# Patient Record
Sex: Female | Born: 1965 | Race: White | Hispanic: No | Marital: Single | Smoking: Never smoker
Health system: Southern US, Community
[De-identification: ages and names within clinical notes are randomized; demographics above are authoritative.]

## PROBLEM LIST (undated history)

## (undated) DIAGNOSIS — E039 Hypothyroidism, unspecified: Secondary | ICD-10-CM

## (undated) DIAGNOSIS — Z9989 Dependence on other enabling machines and devices: Secondary | ICD-10-CM

## (undated) DIAGNOSIS — F32A Depression, unspecified: Secondary | ICD-10-CM

## (undated) DIAGNOSIS — G4733 Obstructive sleep apnea (adult) (pediatric): Secondary | ICD-10-CM

## (undated) DIAGNOSIS — F419 Anxiety disorder, unspecified: Secondary | ICD-10-CM

## (undated) DIAGNOSIS — I10 Essential (primary) hypertension: Secondary | ICD-10-CM

## (undated) DIAGNOSIS — R0789 Other chest pain: Secondary | ICD-10-CM

## (undated) DIAGNOSIS — E079 Disorder of thyroid, unspecified: Secondary | ICD-10-CM

## (undated) DIAGNOSIS — F329 Major depressive disorder, single episode, unspecified: Secondary | ICD-10-CM

## (undated) HISTORY — DX: Essential (primary) hypertension: I10

## (undated) HISTORY — PX: CHOLECYSTECTOMY: SHX55

## (undated) HISTORY — DX: Obstructive sleep apnea (adult) (pediatric): G47.33

## (undated) HISTORY — DX: Disorder of thyroid, unspecified: E07.9

## (undated) HISTORY — DX: Other chest pain: R07.89

## (undated) HISTORY — DX: Morbid (severe) obesity due to excess calories: E66.01

## (undated) HISTORY — DX: Dependence on other enabling machines and devices: Z99.89

---

## 2005-03-11 ENCOUNTER — Ambulatory Visit: Payer: Self-pay | Admitting: Family Medicine

## 2005-03-25 ENCOUNTER — Ambulatory Visit: Payer: Self-pay | Admitting: Family Medicine

## 2011-02-13 ENCOUNTER — Encounter: Payer: Self-pay | Admitting: Cardiovascular Disease

## 2011-02-13 ENCOUNTER — Ambulatory Visit (INDEPENDENT_AMBULATORY_CARE_PROVIDER_SITE_OTHER): Payer: 59 | Admitting: Cardiovascular Disease

## 2011-02-13 DIAGNOSIS — R079 Chest pain, unspecified: Secondary | ICD-10-CM | POA: Insufficient documentation

## 2011-02-13 LAB — PROTIME-INR

## 2011-02-13 MED ORDER — METOPROLOL TARTRATE 25 MG PO TABS: 25.0000 mg | ORAL_TABLET | Freq: Two times a day (BID) | ORAL | Status: DC

## 2011-02-13 NOTE — Assessment & Plan Note (Addendum)
The patient has been having intermittent chest pain over the last few weeks which really overall is atypical in nature. Her risk factors for coronary artery disease include hypertension and family history of premature coronary artery disease. Her nuclear stress test was borderline abnormal and could have been due to  breast attenuation especially with her obesity. Due to all of that, I think the best way to proceed is with coronary CTA to define her coronary anatomy. In the meantime, I asked her to start taking aspirin 81 mg once daily and will also start her on metoprolol 25 mg twice daily. She is to take 50 mg in the morning of the CTA. If the CTA is not approved by her insurance company, then I recommend proceeding with cardiac catheterization for definitive diagnosis. I ordered labs on her which came back normal : Na: 141, K: 4.1, Bun: 11, Crea: 0.88, Hgb: 13.8, INR: 1.0

## 2011-02-13 NOTE — Patient Instructions (Addendum)
Your physician recommends that you schedule a follow-up appointment in: we will call her  Your physician recommends that you return for lab work in: today  Your physician has requested that you have cardiac CT. Cardiac computed tomography (CT) is a painless test that uses an x-ray machine to take clear, detailed pictures of your heart. For further information please visit [email or web address] Please follow instruction sheet as given.  Your physician has recommended you make the following change in your medication: START Metoprolol 25 mg twice daily

## 2011-02-13 NOTE — Progress Notes (Signed)
HPI  Is a 45 year old female who is here today for evaluation of chest pain and slightly abnormal nuclear stress test. The patient has no previous cardiac history. She has risk factors that include hypertension as well as family history of premature coronary artery disease. She started having intermittent chest pain which started about 3 weeks ago. It substernal with no radiation. It's overall mild in nature. It is described as a tightness feeling that lasts for 5 or 10 minutes at most. It happens at rest and does not worsen with activities. It can be triggered by anxiety and stress. It's not associated with dyspnea. There is no heartburn. She was seen by Dr. Benedetto Goad who started her on omeprazole and ordered a nuclear stress test. The patient was able to exercise for 4 minutes and 40 seconds and achieved a maximal heart rate of 162 beats per minute. She did not have any chest pain or ischemic ECG changes. Her nuclear imaging showed a slight decreased activity in the anterior wall on stress images compared with rest imaging. This was suggestive of possible mild anterior ischemia versus shifting breast attenuation.  No Known Allergies   No current outpatient prescriptions on file prior to visit.     Past Medical History  Diagnosis Date  . OSA on CPAP   . Chest tightness   . HTN (hypertension)   . Thyroid disease   . Morbid obesity      Past Surgical History  Procedure Date  . Cholecystectomy      Family History  Problem Relation Age of Onset  . Heart disease       History   Social History  . Marital Status: Single    Spouse Name: N/A    Number of Children: 0  . Years of Education: N/A   Occupational History  . [employer], [employer]    Social History Main Topics  . Smoking status: Never Smoker   . Smokeless tobacco: Not on file  . Alcohol Use: No  . Drug Use: No  . Sexually Active: Not on file   Other Topics Concern  . Not on file   Social History Narrative  . No  narrative on file     ROS Constitutional: Negative for fever, chills, diaphoresis, activity change, appetite change and fatigue.  HENT: Negative for hearing loss, nosebleeds, congestion, sore throat, facial swelling, drooling, trouble swallowing, neck pain, voice change, sinus pressure and tinnitus.  Eyes: Negative for photophobia, pain, discharge and visual disturbance.  Respiratory: Negative for apnea, cough,  shortness of breath and wheezing.  Cardiovascular: Negative for  palpitations and leg swelling.  Gastrointestinal: Negative for nausea, vomiting, abdominal pain, diarrhea, constipation, blood in stool and abdominal distention.  Genitourinary: Negative for dysuria, urgency, frequency, hematuria and decreased urine volume.  Musculoskeletal: Negative for myalgias, back pain, joint swelling, arthralgias and gait problem.  Skin: Negative for color change, pallor, rash and wound.  Neurological: Negative for dizziness, tremors, seizures, syncope, speech difficulty, weakness, light-headedness, numbness and headaches.  Psychiatric/Behavioral: Negative for suicidal ideas, hallucinations, behavioral problems and agitation. The patient is not nervous/anxious.     PHYSICAL EXAM   BP 141/97  Pulse 74  Ht 5\' 4"  (1.626 m)  Wt 315 lb (142.883 kg)  BMI 54.07 kg/m2  SpO2 94%  Constitutional: She is oriented to person, place, and time. She appears well-developed and well-nourished. No distress.  HENT: No nasal discharge.  Head: Normocephalic and atraumatic.  Eyes: Pupils are equal, round, and reactive to light. Right  eye exhibits no discharge. Left eye exhibits no discharge.  Neck: Normal range of motion. Neck supple. No JVD present. No thyromegaly present.  Cardiovascular: Normal rate, regular rhythm, normal heart sounds and intact distal pulses. Exam reveals no gallop and no friction rub.  No murmur heard.  Pulmonary/Chest: Effort normal and breath sounds normal. No stridor. No respiratory  distress. She has no wheezes. She has no rales. She exhibits no tenderness.  Abdominal: Soft. Bowel sounds are normal. She exhibits no distension. There is no tenderness. There is no rebound and no guarding.  Musculoskeletal: Normal range of motion. She exhibits no edema and no tenderness.  Neurological: She is alert and oriented to person, place, and time. Coordination normal.  Skin: Skin is warm and dry. No rash noted. She is not diaphoretic. No erythema. No pallor.  Psychiatric: She has a normal mood and affect. Her behavior is normal. Judgment and thought content normal.    EKG: Normal sinus rhythm with sinus arrhythmia. Low voltage. No significant ST or T wave changes.   ASSESSMENT AND PLAN

## 2011-02-14 ENCOUNTER — Other Ambulatory Visit (HOSPITAL_COMMUNITY): Payer: 59

## 2011-02-17 ENCOUNTER — Other Ambulatory Visit: Payer: Self-pay | Admitting: Cardiovascular Disease

## 2011-02-17 DIAGNOSIS — R9439 Abnormal result of other cardiovascular function study: Secondary | ICD-10-CM

## 2011-02-19 ENCOUNTER — Encounter: Payer: Self-pay | Admitting: *Deleted

## 2011-02-24 ENCOUNTER — Inpatient Hospital Stay (HOSPITAL_COMMUNITY): Admission: RE | Admit: 2011-02-24 | Payer: 59 | Source: Ambulatory Visit

## 2011-02-24 ENCOUNTER — Ambulatory Visit (HOSPITAL_COMMUNITY)
Admission: RE | Admit: 2011-02-24 | Discharge: 2011-02-24 | Disposition: A | Discharge status: Home / Self Care | Payer: 59 | Source: Ambulatory Visit | Attending: Cardiovascular Disease | Admitting: Cardiovascular Disease

## 2011-02-24 ENCOUNTER — Other Ambulatory Visit (HOSPITAL_COMMUNITY): Payer: 59

## 2011-02-24 DIAGNOSIS — R9439 Abnormal result of other cardiovascular function study: Secondary | ICD-10-CM

## 2011-03-03 ENCOUNTER — Telehealth: Payer: Self-pay | Admitting: Cardiovascular Disease

## 2011-03-03 NOTE — Telephone Encounter (Signed)
The patient was scheduled for cardiac CTA. However, the test could not be completed due to inability to place an appropriate IV. The test was canceled. It was also advised by Dr. Eden Emms that the image quality might not be optimal for interpretation due to morbid obesity. I discussed this with the patient. The only way forward for a definitive diagnosis his cardiac catheterization. However, when I talked with her it appears that the patient's chest pain has resolved. It's possible that the nuclear stress test was borderline abnormal due to breast attenuation. I advised the patient to notify us if she gets any recurrent chest pain. Cardiac catheterization can be arranged at that time if needed.

## 2015-07-20 ENCOUNTER — Other Ambulatory Visit: Payer: Self-pay | Admitting: Orthopedic Surgery

## 2015-07-20 DIAGNOSIS — M25561 Pain in right knee: Secondary | ICD-10-CM

## 2015-08-05 ENCOUNTER — Ambulatory Visit
Admission: RE | Admit: 2015-08-05 | Discharge: 2015-08-05 | Disposition: A | Discharge status: Home / Self Care | Payer: Self-pay | Source: Ambulatory Visit | Attending: Orthopedic Surgery | Admitting: Orthopedic Surgery

## 2015-08-05 DIAGNOSIS — M25561 Pain in right knee: Secondary | ICD-10-CM

## 2015-08-28 ENCOUNTER — Other Ambulatory Visit: Payer: Self-pay | Admitting: Orthopedic Surgery

## 2015-08-30 ENCOUNTER — Encounter (HOSPITAL_COMMUNITY)
Admission: RE | Admit: 2015-08-30 | Discharge: 2015-08-30 | Disposition: A | Discharge status: Home / Self Care | Payer: BLUE CROSS/BLUE SHIELD | Source: Ambulatory Visit | Attending: Orthopedic Surgery | Admitting: Orthopedic Surgery

## 2015-08-30 ENCOUNTER — Encounter (HOSPITAL_COMMUNITY): Payer: Self-pay

## 2015-08-30 DIAGNOSIS — S83206A Unspecified tear of unspecified meniscus, current injury, right knee, initial encounter: Secondary | ICD-10-CM | POA: Diagnosis not present

## 2015-08-30 DIAGNOSIS — Z01812 Encounter for preprocedural laboratory examination: Secondary | ICD-10-CM | POA: Insufficient documentation

## 2015-08-30 DIAGNOSIS — X58XXXA Exposure to other specified factors, initial encounter: Secondary | ICD-10-CM | POA: Insufficient documentation

## 2015-08-30 DIAGNOSIS — Z01818 Encounter for other preprocedural examination: Secondary | ICD-10-CM | POA: Insufficient documentation

## 2015-08-30 DIAGNOSIS — I10 Essential (primary) hypertension: Secondary | ICD-10-CM | POA: Diagnosis not present

## 2015-08-30 HISTORY — DX: Anxiety disorder, unspecified: F41.9

## 2015-08-30 HISTORY — DX: Hypothyroidism, unspecified: E03.9

## 2015-08-30 HISTORY — DX: Depression, unspecified: F32.A

## 2015-08-30 HISTORY — DX: Major depressive disorder, single episode, unspecified: F32.9

## 2015-08-30 LAB — CBC
HEMATOCRIT: 43 % (ref 36.0–46.0)
HEMOGLOBIN: 13.8 g/dL (ref 12.0–15.0)
MCH: 27.9 pg (ref 26.0–34.0)
MCHC: 32.1 g/dL (ref 30.0–36.0)
MCV: 86.9 fL (ref 78.0–100.0)
Platelets: 306 10*3/uL (ref 150–400)
RBC: 4.95 MIL/uL (ref 3.87–5.11)
RDW: 14.6 % (ref 11.5–15.5)
WBC: 9.7 10*3/uL (ref 4.0–10.5)

## 2015-08-30 LAB — BASIC METABOLIC PANEL
ANION GAP: 12 (ref 5–15)
BUN: 15 mg/dL (ref 6–20)
CALCIUM: 9.4 mg/dL (ref 8.9–10.3)
CHLORIDE: 104 mmol/L (ref 101–111)
CO2: 22 mmol/L (ref 22–32)
Creatinine, Ser: 0.92 mg/dL (ref 0.44–1.00)
GFR calc Af Amer: >60 mL/min (ref >60)
GFR calc non Af Amer: >60 mL/min (ref >60)
GLUCOSE: 181 mg/dL — AB (ref 65–99)
Potassium: 4.5 mmol/L (ref 3.5–5.1)
Sodium: 138 mmol/L (ref 135–145)

## 2015-08-30 NOTE — Pre-Procedure Instructions (Signed)
    Mckinzee E Kullman  08/30/2015      [facility] - [place] - [address] [place] [address] Phone: [phone] Fax: [phone]    Your procedure is scheduled on 09/03/15.  [facility] Admitting at 930 A.M.  Call this number if you have problems the morning of surgery:  [phone]   Remember:  Do not eat food or drink liquids after midnight.  Take these medicines the morning of surgery with A SIP OF WATER --norvasc,synthroid,lopressor,ultram   Do not wear jewelry, make-up or nail polish.  Do not wear lotions, powders, or perfumes.  You may wear deodorant.  Do not shave 48 hours prior to surgery.  Men may shave face and neck.  Do not bring valuables to the hospital.  [facility] is not responsible for any belongings or valuables.  Contacts, dentures or bridgework may not be worn into surgery.  Leave your suitcase in the car.  After surgery it may be brought to your room.  For patients admitted to the hospital, discharge time will be determined by your treatment team.  Patients discharged the day of surgery will not be allowed to drive home.   Name and phone number of your driver:   Special instructions:    Please read over the following fact sheets that you were given. Pain Booklet, Coughing and Deep Breathing and Surgical Site Infection Prevention

## 2015-09-02 MED ORDER — CHLORHEXIDINE GLUCONATE 4 % EX LIQD: 60.0000 mL | Freq: Once | CUTANEOUS | Status: DC

## 2015-09-02 MED ORDER — SODIUM CHLORIDE 0.9 % IV SOLN: INTRAVENOUS | Status: DC

## 2015-09-02 MED ORDER — ACETAMINOPHEN 500 MG PO TABS: 1000.0000 mg | ORAL_TABLET | Freq: Once | ORAL | Status: DC

## 2015-09-03 ENCOUNTER — Ambulatory Visit (HOSPITAL_COMMUNITY): Payer: BLUE CROSS/BLUE SHIELD | Admitting: Critical Care Medicine

## 2015-09-03 ENCOUNTER — Ambulatory Visit (HOSPITAL_COMMUNITY)
Admission: RE | Admit: 2015-09-03 | Discharge: 2015-09-03 | Disposition: A | Discharge status: Home / Self Care | Payer: BLUE CROSS/BLUE SHIELD | Source: Ambulatory Visit | Attending: Orthopedic Surgery | Admitting: Orthopedic Surgery

## 2015-09-03 ENCOUNTER — Encounter (HOSPITAL_COMMUNITY): Payer: Self-pay | Admitting: *Deleted

## 2015-09-03 ENCOUNTER — Encounter (HOSPITAL_COMMUNITY)
Admission: RE | Disposition: A | Discharge status: Home / Self Care | Payer: Self-pay | Source: Ambulatory Visit | Attending: Orthopedic Surgery

## 2015-09-03 DIAGNOSIS — Z79899 Other long term (current) drug therapy: Secondary | ICD-10-CM | POA: Insufficient documentation

## 2015-09-03 DIAGNOSIS — G4733 Obstructive sleep apnea (adult) (pediatric): Secondary | ICD-10-CM | POA: Insufficient documentation

## 2015-09-03 DIAGNOSIS — M2241 Chondromalacia patellae, right knee: Secondary | ICD-10-CM | POA: Diagnosis not present

## 2015-09-03 DIAGNOSIS — F329 Major depressive disorder, single episode, unspecified: Secondary | ICD-10-CM | POA: Insufficient documentation

## 2015-09-03 DIAGNOSIS — E039 Hypothyroidism, unspecified: Secondary | ICD-10-CM | POA: Insufficient documentation

## 2015-09-03 DIAGNOSIS — S83241A Other tear of medial meniscus, current injury, right knee, initial encounter: Secondary | ICD-10-CM | POA: Insufficient documentation

## 2015-09-03 DIAGNOSIS — I10 Essential (primary) hypertension: Secondary | ICD-10-CM | POA: Diagnosis not present

## 2015-09-03 DIAGNOSIS — X58XXXA Exposure to other specified factors, initial encounter: Secondary | ICD-10-CM | POA: Insufficient documentation

## 2015-09-03 HISTORY — PX: KNEE ARTHROSCOPY WITH MEDIAL MENISECTOMY: SHX5651

## 2015-09-03 SURGERY — ARTHROSCOPY, KNEE, WITH MEDIAL MENISCECTOMY
Anesthesia: General | Site: Knee | Laterality: Right

## 2015-09-03 MED ORDER — SCOPOLAMINE 1 MG/3DAYS TD PT72
MEDICATED_PATCH | TRANSDERMAL | Status: DC | PRN
  Administered 2015-09-03: 1 via TRANSDERMAL

## 2015-09-03 MED ORDER — LIDOCAINE HCL (CARDIAC) 20 MG/ML IV SOLN
INTRAVENOUS | Status: DC | PRN
  Administered 2015-09-03: 80 mg via INTRAVENOUS

## 2015-09-03 MED ORDER — FENTANYL CITRATE (PF) 100 MCG/2ML IJ SOLN
25.0000 ug | INTRAMUSCULAR | Status: DC | PRN
  Administered 2015-09-03 (×2): 25 ug via INTRAVENOUS
  Administered 2015-09-03: 50 ug via INTRAVENOUS

## 2015-09-03 MED ORDER — METOCLOPRAMIDE HCL 5 MG/ML IJ SOLN: 10.0000 mg | Freq: Once | INTRAMUSCULAR | Status: DC | PRN

## 2015-09-03 MED ORDER — SODIUM CHLORIDE 0.9 % IR SOLN
Status: DC | PRN
  Administered 2015-09-03: 6000 mL

## 2015-09-03 MED ORDER — SUCCINYLCHOLINE CHLORIDE 20 MG/ML IJ SOLN
INTRAMUSCULAR | Status: DC | PRN
  Administered 2015-09-03: 140 mg via INTRAVENOUS

## 2015-09-03 MED ORDER — PROPOFOL 10 MG/ML IV BOLUS
INTRAVENOUS | Status: AC
  Filled 2015-09-03: qty 20

## 2015-09-03 MED ORDER — PROPOFOL 10 MG/ML IV BOLUS
INTRAVENOUS | Status: DC | PRN
  Administered 2015-09-03: 200 mg via INTRAVENOUS
  Administered 2015-09-03: 100 mg via INTRAVENOUS

## 2015-09-03 MED ORDER — HYDROCODONE-ACETAMINOPHEN 5-325 MG PO TABS: 1.0000 | ORAL_TABLET | Freq: Four times a day (QID) | ORAL | Status: DC | PRN

## 2015-09-03 MED ORDER — BUPIVACAINE-EPINEPHRINE (PF) 0.5% -1:200000 IJ SOLN
INTRAMUSCULAR | Status: AC
  Filled 2015-09-03: qty 30

## 2015-09-03 MED ORDER — HYDROCODONE-ACETAMINOPHEN 7.5-325 MG PO TABS: 1.0000 | ORAL_TABLET | Freq: Once | ORAL | Status: DC | PRN

## 2015-09-03 MED ORDER — ONDANSETRON HCL 4 MG/2ML IJ SOLN
INTRAMUSCULAR | Status: DC | PRN
  Administered 2015-09-03: 4 mg via INTRAVENOUS

## 2015-09-03 MED ORDER — FENTANYL CITRATE (PF) 100 MCG/2ML IJ SOLN
INTRAMUSCULAR | Status: DC | PRN
  Administered 2015-09-03: 50 ug via INTRAVENOUS
  Administered 2015-09-03: 100 ug via INTRAVENOUS

## 2015-09-03 MED ORDER — IBUPROFEN 800 MG PO TABS: 800.0000 mg | ORAL_TABLET | Freq: Three times a day (TID) | ORAL | Status: AC

## 2015-09-03 MED ORDER — SUCCINYLCHOLINE CHLORIDE 20 MG/ML IJ SOLN
INTRAMUSCULAR | Status: AC
  Filled 2015-09-03: qty 1

## 2015-09-03 MED ORDER — FENTANYL CITRATE (PF) 250 MCG/5ML IJ SOLN
INTRAMUSCULAR | Status: AC
  Filled 2015-09-03: qty 5

## 2015-09-03 MED ORDER — MIDAZOLAM HCL 2 MG/2ML IJ SOLN
INTRAMUSCULAR | Status: AC
  Filled 2015-09-03: qty 2

## 2015-09-03 MED ORDER — PHENYLEPHRINE HCL 10 MG/ML IJ SOLN
INTRAMUSCULAR | Status: DC | PRN
  Administered 2015-09-03: 80 ug via INTRAVENOUS

## 2015-09-03 MED ORDER — LIDOCAINE HCL (CARDIAC) 20 MG/ML IV SOLN
INTRAVENOUS | Status: AC
  Filled 2015-09-03: qty 5

## 2015-09-03 MED ORDER — MEPERIDINE HCL 25 MG/ML IJ SOLN: 6.2500 mg | INTRAMUSCULAR | Status: DC | PRN

## 2015-09-03 MED ORDER — FENTANYL CITRATE (PF) 100 MCG/2ML IJ SOLN
INTRAMUSCULAR | Status: AC
  Filled 2015-09-03: qty 2

## 2015-09-03 MED ORDER — MIDAZOLAM HCL 5 MG/5ML IJ SOLN
INTRAMUSCULAR | Status: DC | PRN
  Administered 2015-09-03: 2 mg via INTRAVENOUS

## 2015-09-03 MED ORDER — BUPIVACAINE-EPINEPHRINE 0.5% -1:200000 IJ SOLN
INTRAMUSCULAR | Status: DC | PRN
  Administered 2015-09-03: 20 mL

## 2015-09-03 MED ORDER — LACTATED RINGERS IV SOLN
INTRAVENOUS | Status: DC
  Administered 2015-09-03 (×2): via INTRAVENOUS

## 2015-09-03 MED ORDER — ONDANSETRON HCL 4 MG/2ML IJ SOLN
INTRAMUSCULAR | Status: AC
  Filled 2015-09-03: qty 2

## 2015-09-03 MED ORDER — SCOPOLAMINE 1 MG/3DAYS TD PT72
MEDICATED_PATCH | TRANSDERMAL | Status: AC
  Filled 2015-09-03: qty 1

## 2015-09-03 SURGICAL SUPPLY — 34 items
BANDAGE ACE 6X5 VEL STRL LF (GAUZE/BANDAGES/DRESSINGS) ×3 IMPLANT
BANDAGE ELASTIC 6 VELCRO ST LF (GAUZE/BANDAGES/DRESSINGS) ×3 IMPLANT
BLADE GREAT WHITE 4.2 (BLADE) ×2 IMPLANT
BLADE GREAT WHITE 4.2MM (BLADE) ×1
DRAPE ARTHROSCOPY W/POUCH 114 (DRAPES) ×3 IMPLANT
DRAPE U-SHAPE 47X51 STRL (DRAPES) ×3 IMPLANT
DRSG PAD ABDOMINAL 8X10 ST (GAUZE/BANDAGES/DRESSINGS) ×3 IMPLANT
ELECT REM PT RETURN 9FT ADLT (ELECTROSURGICAL)
ELECTRODE REM PT RTRN 9FT ADLT (ELECTROSURGICAL) IMPLANT
GAUZE SPONGE 4X4 12PLY STRL (GAUZE/BANDAGES/DRESSINGS) ×3 IMPLANT
GAUZE XEROFORM 1X8 LF (GAUZE/BANDAGES/DRESSINGS) ×3 IMPLANT
GLOVE BIOGEL PI IND STRL 8.5 (GLOVE) ×5 IMPLANT
GLOVE BIOGEL PI INDICATOR 8.5 (GLOVE) ×10
GLOVE SURG ORTHO 8.0 STRL STRW (GLOVE) ×18 IMPLANT
GOWN STRL REUS W/ TWL LRG LVL3 (GOWN DISPOSABLE) ×1 IMPLANT
GOWN STRL REUS W/TWL 2XL LVL3 (GOWN DISPOSABLE) ×3 IMPLANT
GOWN STRL REUS W/TWL LRG LVL3 (GOWN DISPOSABLE) ×3
GOWN STRL REUS W/TWL XL LVL3 (GOWN DISPOSABLE) ×6 IMPLANT
KIT ROOM TURNOVER OR (KITS) ×3 IMPLANT
MANIFOLD NEPTUNE II (INSTRUMENTS) ×3 IMPLANT
PACK ARTHROSCOPY DSU (CUSTOM PROCEDURE TRAY) ×3 IMPLANT
PAD ARMBOARD 7.5X6 YLW CONV (MISCELLANEOUS) ×6 IMPLANT
PADDING CAST COTTON 6X4 STRL (CAST SUPPLIES) ×2 IMPLANT
PENCIL BUTTON HOLSTER BLD 10FT (ELECTRODE) IMPLANT
SET ARTHROSCOPY TUBING (MISCELLANEOUS) ×3
SET ARTHROSCOPY TUBING LN (MISCELLANEOUS) ×1 IMPLANT
SPONGE GAUZE 4X4 12PLY STER LF (GAUZE/BANDAGES/DRESSINGS) ×2 IMPLANT
SPONGE LAP 4X18 X RAY DECT (DISPOSABLE) ×3 IMPLANT
SUT ETHILON 4 0 PS 2 18 (SUTURE) ×3 IMPLANT
SYR 30ML LL (SYRINGE) ×6 IMPLANT
TOWEL OR 17X24 6PK STRL BLUE (TOWEL DISPOSABLE) ×3 IMPLANT
TOWEL OR 17X26 10 PK STRL BLUE (TOWEL DISPOSABLE) ×3 IMPLANT
WAND HAND CNTRL MULTIVAC 90 (MISCELLANEOUS) IMPLANT
WATER STERILE IRR 1000ML POUR (IV SOLUTION) ×3 IMPLANT

## 2015-09-03 NOTE — Transfer of Care (Signed)
Immediate Anesthesia Transfer of Care Note  Patient: Michaela Garcia  Procedure(s) Performed: Procedure(s): RIGHT KNEE ARTHROSCOPY WITH MEDIAL MENISECTOMY (Right)  Patient Location: PACU   Anesthesia Type:General  Level of Consciousness: awake, alert , oriented and patient cooperative  Airway & Oxygen Therapy: Patient Spontanous Breathing and Patient connected to face mask oxygen  Post-op Assessment: Report given to RN, Post -op Vital signs reviewed and stable and Patient moving all extremities  Post vital signs: Reviewed and stable  Last Vitals:  Filed Vitals:   09/03/15 0926  BP: 158/83  Pulse: 99  Temp: 36.8 C  Resp: 20    Complications: No apparent anesthesia complications

## 2015-09-03 NOTE — Anesthesia Procedure Notes (Signed)
Procedure Name: Intubation Date/Time: 09/03/2015 11:43 AM Performed by: Ferol LuzMCMILLEN, Shelise Maron L Pre-anesthesia Checklist: Patient identified, Emergency Drugs available, Suction available and Patient being monitored Patient Re-evaluated:Patient Re-evaluated prior to inductionOxygen Delivery Method: Circle system utilized Preoxygenation: Pre-oxygenation with 100% oxygen Intubation Type: IV induction Laryngoscope Size: Glidescope and 4 Grade View: Grade II Tube type: Oral Tube size: 7.5 mm Number of attempts: 1 Airway Equipment and Method: Stylet Placement Confirmation: ETT inserted through vocal cords under direct vision,  positive ETCO2 and breath sounds checked- equal and bilateral Secured at: 21 cm Tube secured with: Tape Dental Injury: Teeth and Oropharynx as per pre-operative assessment

## 2015-09-03 NOTE — Anesthesia Preprocedure Evaluation (Addendum)
Anesthesia Evaluation  Patient identified by MRN, date of birth, ID band Patient awake    Reviewed: Allergy & Precautions, NPO status , Patient's Chart, lab work & pertinent test results  Airway Mallampati: III  TM Distance: >3 FB Neck ROM: Full    Dental  (+) Teeth Intact   Pulmonary sleep apnea and Continuous Positive Airway Pressure Ventilation ,    Pulmonary exam normal breath sounds clear to auscultation       Cardiovascular hypertension, Pt. on medications Normal cardiovascular exam Rhythm:Regular Rate:Normal     Neuro/Psych PSYCHIATRIC DISORDERS Anxiety Depression negative neurological ROS     GI/Hepatic negative GI ROS, Neg liver ROS,   Endo/Other  Hypothyroidism Morbid obesity  Renal/GU negative Renal ROS  negative genitourinary   Musculoskeletal TMM right knee    Abdominal (+) + obese,   Peds  Hematology   Anesthesia Other Findings   Reproductive/Obstetrics negative OB ROS                            Anesthesia Physical Anesthesia Plan  ASA: III  Anesthesia Plan: General   Post-op Pain Management:    Induction: Intravenous  Airway Management Planned: Oral ETT  Additional Equipment:   Intra-op Plan:   Post-operative Plan: Extubation in OR  Informed Consent: I have reviewed the patients History and Physical, chart, labs and discussed the procedure including the risks, benefits and alternatives for the proposed anesthesia with the patient or authorized representative who has indicated his/her understanding and acceptance.   Dental advisory given  Plan Discussed with: CRNA, Anesthesiologist and Surgeon  Anesthesia Plan Comments:         Anesthesia Quick Evaluation

## 2015-09-03 NOTE — H&P (Signed)
Michaela Garcia MRN:  [id number] DOB/SEX:  03/04/1966/female  CHIEF COMPLAINT:  Painful right Knee  HISTORY: Patient is a 50 y.o. female presented with a history of pain in the right knee. Onset of symptoms was abrupt starting a few weeks ago with gradually worsening course since that time. Patient has been treated conservatively with over-the-counter NSAIDs and activity modification. Patient currently rates pain in the knee at 10 out of 10 with activity. There is pain at night.  PAST MEDICAL HISTORY: Patient Active Problem List   Diagnosis Date Noted  . Chest pain 02/13/2011   Past Medical History  Diagnosis Date  . OSA on CPAP   . Chest tightness   . HTN (hypertension)   . Thyroid disease   . Morbid obesity (HCC)   . Hypothyroidism   . Anxiety   . Depression     past panic attack   Past Surgical History  Procedure Laterality Date  . Cholecystectomy       MEDICATIONS:   No prescriptions prior to admission    ALLERGIES:   Allergies  Allergen Reactions  . Ace Inhibitors Other (See Comments)    Cough     REVIEW OF SYSTEMS:  A comprehensive review of systems was negative except for: Musculoskeletal: positive for arthralgias and stiff joints   FAMILY HISTORY:   Family History  Problem Relation Age of Onset  . Heart disease      SOCIAL HISTORY:   Social History  Substance Use Topics  . Smoking status: Never Smoker   . Smokeless tobacco: Not on file  . Alcohol Use: No     EXAMINATION:  Vital signs in last 24 hours:    There were no vitals taken for this visit. General appearance: alert and cooperative Lungs: clear to auscultation bilaterally Abdomen: soft, non-tender; bowel sounds normal; no masses,  no organomegaly Extremities: extremities normal, atraumatic, no cyanosis or edema Skin: Skin color, texture, turgor normal. No rashes or lesions  Musculoskeletal:  ROM 0-120, Ligaments intact,  Imaging Review Plain radiographs demonstrate mild  degenerative joint disease of the right knee. The overall alignment is neutral. The bone quality appears to be excellent for age and reported activity level.  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 The patient has failed conservative treatment.  The clearance notes were reviewed.  After discussion with the patient it was felt that arthroscopic knee surgery was indicated. The procedure,  risks, and benefits of arthroscopy were presented and reviewed. The patient acknowledged the explanation, agreed to proceed with the plan.  Guy SandiferColby Alan Robbins 09/03/2015, 6:29 AM

## 2015-09-03 NOTE — Discharge Instructions (Signed)
Knee Arthroscopy, Care After Refer to this sheet in the next few weeks. These instructions provide you with information about caring for yourself after your procedure. Your health care provider may also give you more specific instructions. Your treatment has been planned according to current medical practices, but problems sometimes occur. Call your health care provider if you have any problems or questions after your procedure. WHAT TO EXPECT AFTER THE PROCEDURE After your procedure, it is common to have:  Soreness.  Pain. HOME CARE INSTRUCTIONS Bathing  Do not take baths, swim, or use a hot tub until follow up; you may take a quick shower on the 5th day after surgery. Incision Care  There are many different ways to close and cover an incision, including stitches, skin glue, and adhesive strips. Follow your health care provider's instructions about:  Incision care.  Bandage (dressing) changes and removal.-YOU MAY REMOVE YOUR KNEE DRESSING ON THE 3RD DAY AFTER SURGERY AND REPLACE IT WITH BAND AIDES FOR THE INCISIONS.  Incision closure removal.  Check your incision area every day for signs of infection. Watch for:  Redness, swelling, or pain.  Fluid, blood, or pus. Activity  Avoid strenuous activities for as long as directed by your health care provider.  Return to your normal activities as directed by your health care provider. Ask your health care provider what activities are safe for you.  Perform range-of-motion exercises only as directed by your health care provider.  Do not lift anything that is heavier than 10 lb (4.5 kg).  Do not drive or operate heavy machinery while taking pain medicine.  If you were given crutches, use them as directed by your health care provider. Managing Pain, Stiffness, and Swelling  If directed, apply ice to the injured area:  Put ice in a plastic bag.  Place a towel between your skin and the bag.  Leave the ice on for 20 minutes, 2-3  times per day.  Raise the injured area above the level of your heart while you are sitting or lying down as directed by your health care provider. General Instructions  Keep all follow-up visits as directed by your health care provider. This is important.  Take medicines only as directed by your health care provider.  Do not use any tobacco products, including cigarettes, chewing tobacco, or electronic cigarettes. If you need help quitting, ask your health care provider.  If you were given compression stockings, wear them as directed by your health care provider. These stockings help prevent blood clots and reduce swelling in your legs. SEEK MEDICAL CARE IF:  You have severe pain with any movement of your knee.  You notice a bad smell coming from the incision or dressing.  You have redness, swelling, or pain at the site of your incision.  You have fluid, blood, or pus coming from your incision. SEEK IMMEDIATE MEDICAL CARE IF:  You develop a rash.  You have a fever.  You have difficulty breathing or have shortness of breath.  You develop pain in your calves or in the back of your knee.  You develop chest pain.  You develop numbness or tingling in your leg or foot.   This information is not intended to replace advice given to you by your health care provider. Make sure you discuss any questions you have with your health care provider.   Document Released: 12/06/2004 Document Revised: 10/03/2014 Document Reviewed: 05/15/2014 Elsevier Interactive Patient Education [employer]2016 Elsevier Inc.

## 2015-09-03 NOTE — Anesthesia Postprocedure Evaluation (Signed)
Anesthesia Post Note  Patient: Michaela Garcia  Procedure(s) Performed: Procedure(s) (LRB): RIGHT KNEE ARTHROSCOPY WITH MEDIAL MENISECTOMY (Right)  Patient location during evaluation: PACU Anesthesia Type: General Level of consciousness: awake and alert and oriented Pain management: pain level controlled Vital Signs Assessment: post-procedure vital signs reviewed and stable Respiratory status: nonlabored ventilation, spontaneous breathing and respiratory function stable Cardiovascular status: blood pressure returned to baseline and stable Postop Assessment: no signs of nausea or vomiting Anesthetic complications: no    Last Vitals:  Filed Vitals:   09/03/15 1301 09/03/15 1316  BP:  135/87  Pulse: 73 80  Temp:    Resp: 17 21    Last Pain:  Filed Vitals:   09/03/15 1322  PainSc: 5                  Tericka Devincenzi A.

## 2015-09-03 NOTE — Progress Notes (Signed)
Orthopedic Tech Progress Note Patient Details:  Michaela FreestoneJoy E Garcia 08/11/1965 [id number]  Ortho Devices Type of Ortho Device: Crutches Ortho Device/Splint Interventions: Adjustment   [name] 09/03/2015, 3:51 PM

## 2015-09-04 ENCOUNTER — Encounter (HOSPITAL_COMMUNITY): Payer: Self-pay | Admitting: Orthopedic Surgery

## 2015-09-04 NOTE — Addendum Note (Signed)
Addendum  created 09/04/15 1326 by Andree ElkMichael L Hasnain Manheim, CRNA   Modules edited: Anesthesia Events, Narrator   Narrator:  Narrator: Event Log Edited

## 2015-09-04 NOTE — Op Note (Signed)
Dictation Number:  [id number]

## 2015-09-05 NOTE — Op Note (Signed)
NAME:  [name], Michaela                ACCOUNT NO.:  [id number]  MEDICAL RECORD NO.:  [id number]  LOCATION:  [facility]                         FACILITY:  [facility]  PHYSICIAN:  Mila HomerStephen D. Sherlean FootLucey, M.D. DATE OF BIRTH:  [birth year]  DATE OF CONSULTATION:  09/03/2015 DATE OF DISCHARGE:  09/03/2015                              Operative Note    ASSISTANT:  None.  ANESTHESIA:  General.  PREOPERATIVE DIAGNOSIS:  Right knee medial meniscus tear.  POSTOPERATIVE DIAGNOSIS:  Right knee medial meniscus tear.  PROCEDURE:  Right knee arthroscopy with partial medial meniscectomy.  INDICATION FOR PROCEDURE:  The patient is a 50 year old white female with medial meniscus tear on MRI scan.  Informed consent was obtained.  DESCRIPTION OF PROCEDURE:  The patient was laid supine, administered general anesthesia.  The right knee was prepped and draped in usual fashion.  Inferolateral and inferomedial portals were created with a #11 blade, blunt trocar, and cannula.  Diagnostic arthroscopy revealed no chondromalacia in the patellofemoral joint.  I went into the medial compartment.  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  Light chondroplasty was carried out with great White shaver. There was a posterior horn meniscal root tear, which was unstable.  This was resected with a small great White shaver removing approximately 25% of the entirety of the medial meniscus and the debris removed with a [employer] White shaver.  I then went into the notch, the ACL and PCL were normal.  I then went into the lateral compartment and it was normal.  I then lavaged and closed with 4 nylon sutures, dressed with Xeroform, dressing sponges, sterile Webril, and an Ace wrap.  COMPLICATIONS:  None.  DRAINS:  None.          ______________________________ Mila HomerStephen D. Sherlean FootLucey, M.D.     SDL/MEDQ  D:  09/04/2015  T:  09/05/2015  Job:  [id number]

## 2016-07-09 IMAGING — MR MR KNEE*R* W/O CM
4 of 6 series · 15 of 40 positions shown · non-contrast
Comparison: None.

CLINICAL DATA: Right knee pain and instability since tripping over
her dog 2 weeks ago.

EXAM:
MRI OF THE RIGHT KNEE WITHOUT CONTRAST
TECHNIQUE: Multiplanar, multisequence MR imaging of the knee was performed. No
intravenous contrast was administered.

[Series 4: PD · axial · 4.5mm · 0.25mm/px · z∈[-32,+45]mm · 6 of 20 slices shown (1 of 2)]
[im 1/20]
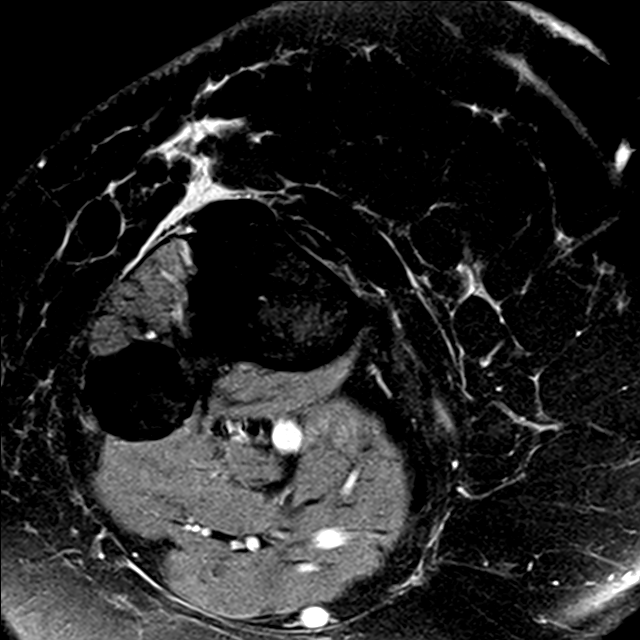
[im 4/20]
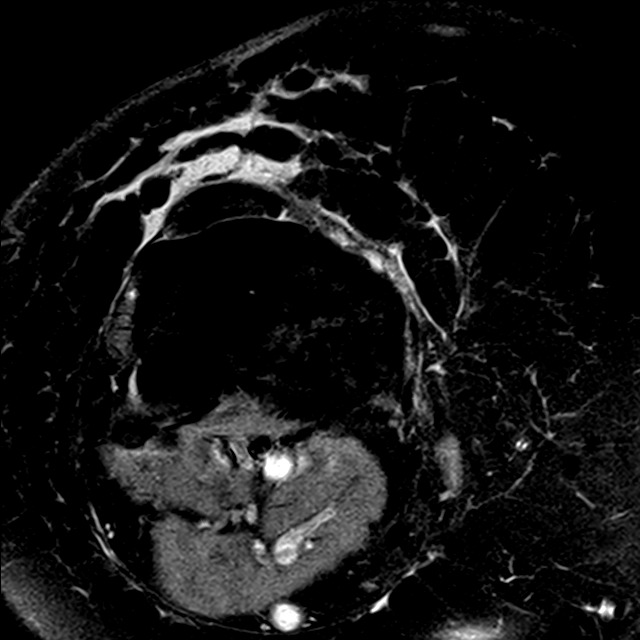
[im 7/20]
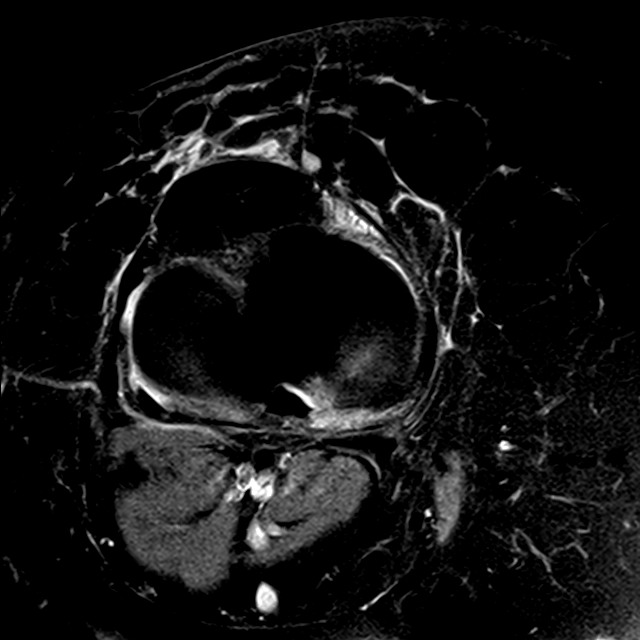
[im 10/20]
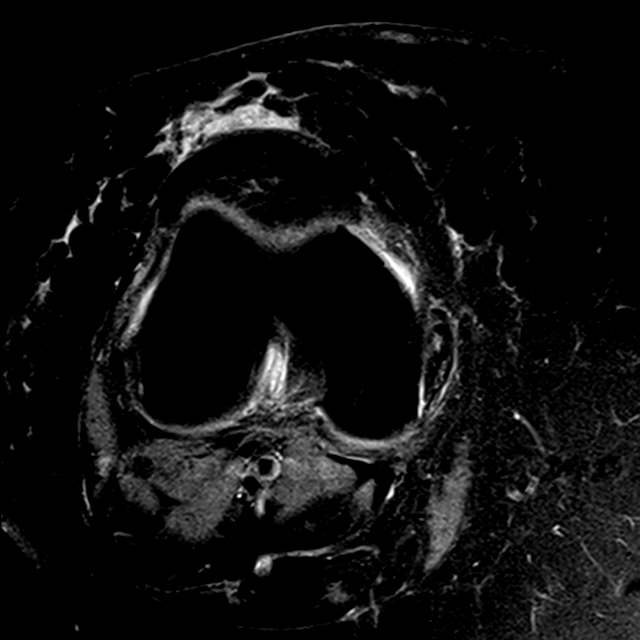
[im 13/20]
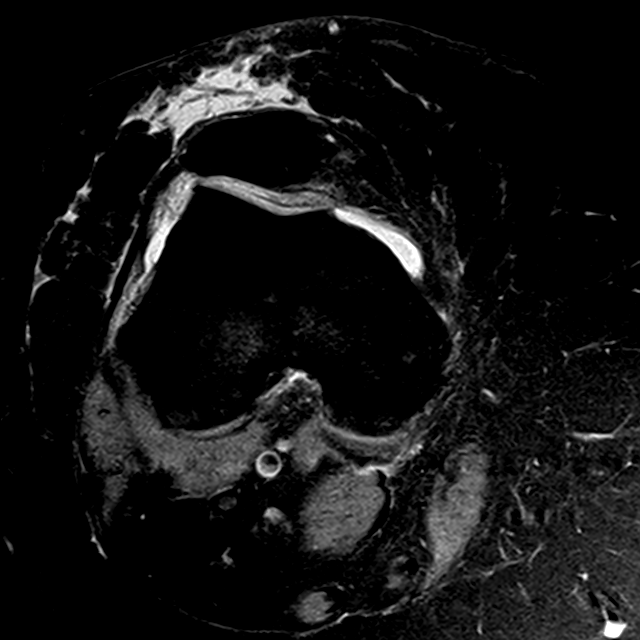
[im 16/20]
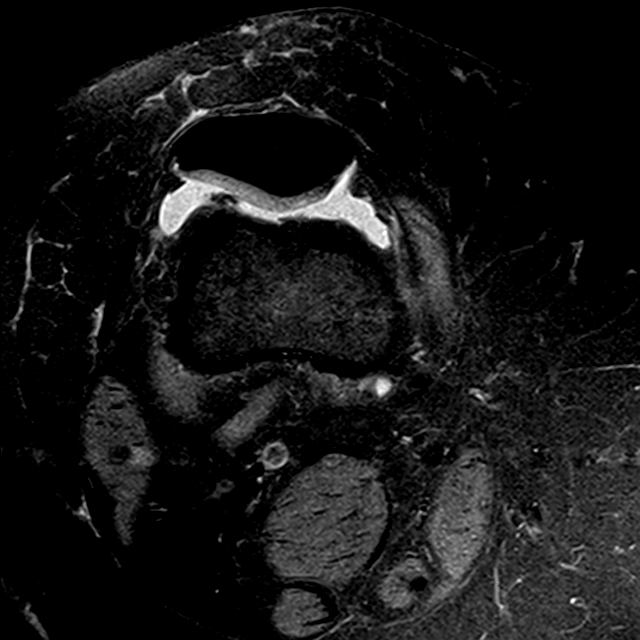

[Series 5: PD · coronal · 4.0mm · 0.33mm/px · 3 of 19 slices shown (2 of 2)]
[im 4/19]
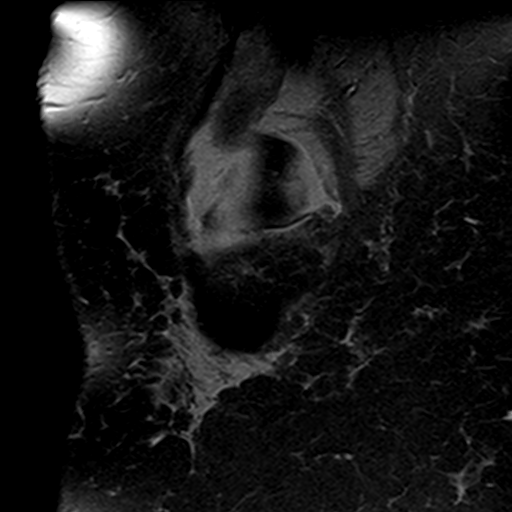
[im 10/19]
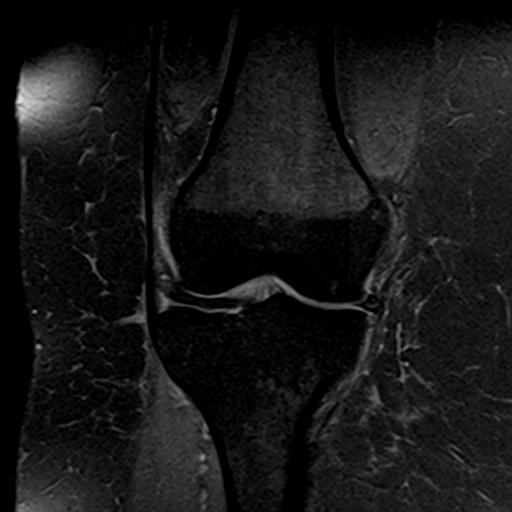
[im 16/19]
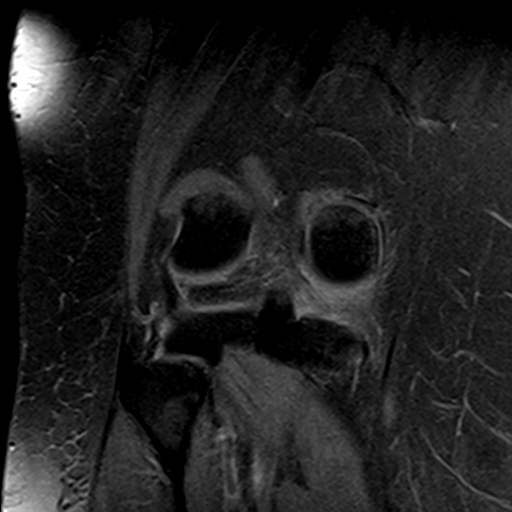

[Series 6: PD fat-sat · sagittal · 4.0mm · 0.31mm/px · 3 of 21 slices shown]
[im 3/21]
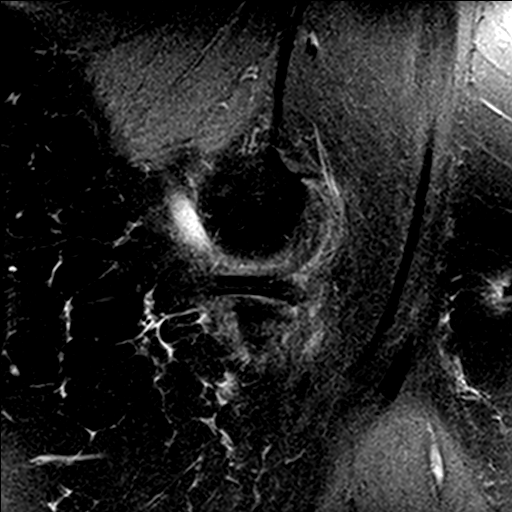
[im 12/21]
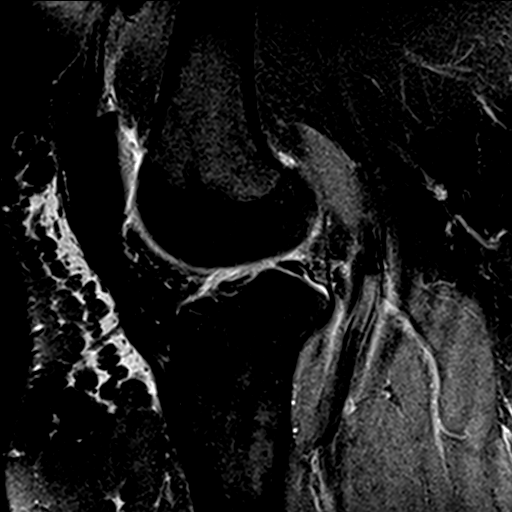
[im 18/21]
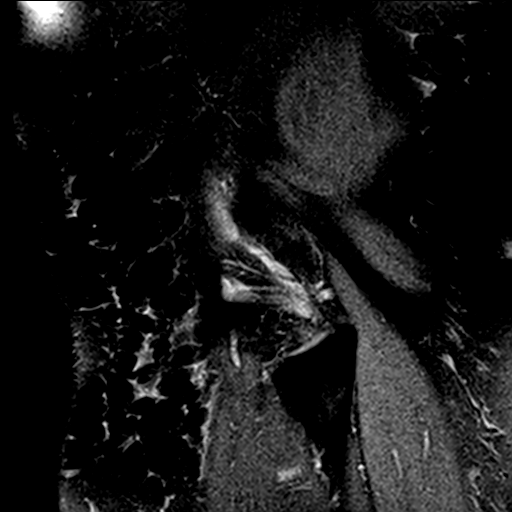

[Series 7: T2 · coronal · 4.0mm · 0.33mm/px · 3 of 19 slices shown]
[im 4/19]
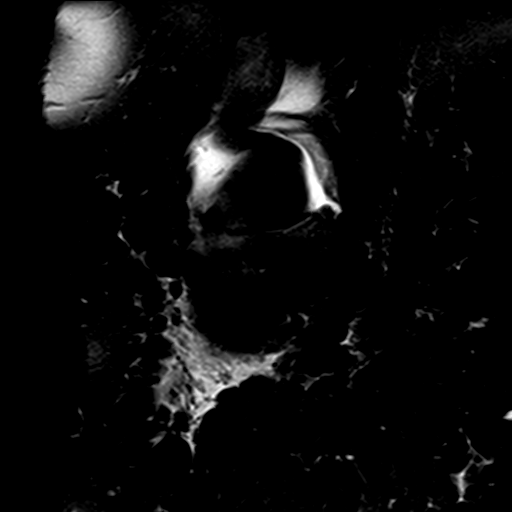
[im 10/19]
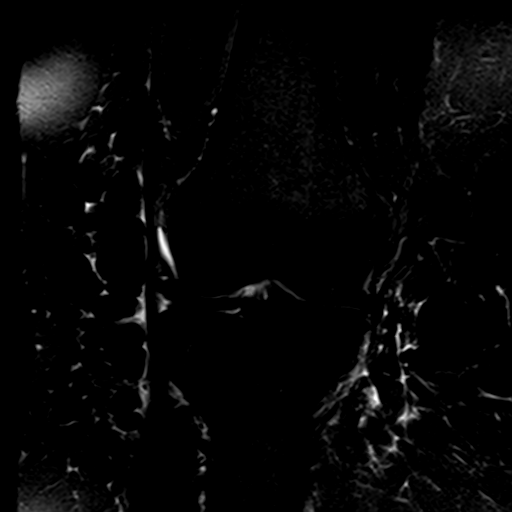
[im 16/19]
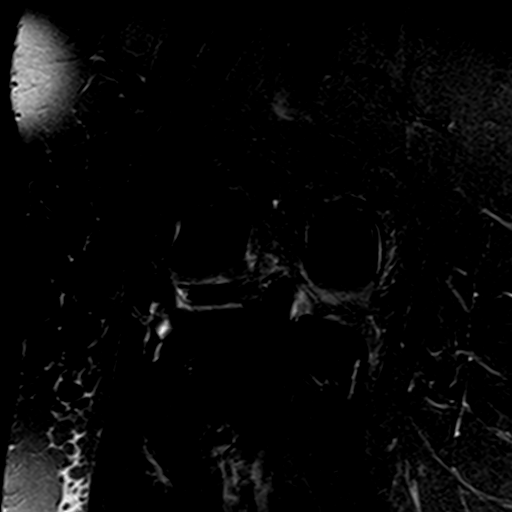

[15 of 40 positions shown; findings below may reference images not displayed]

FINDINGS: MENISCI

Medial meniscus: There is a full-thickness radial tear involving the
posterior horn at the meniscal with approximately 10 mm of
distraction and associated medial protrusion of the meniscus. Small
inferior and free edge tears are also noted in the midbody region.

Lateral meniscus:  Intact.

LIGAMENTS

Cruciates:  Intact.

Collaterals:  Intact.

CARTILAGE

Patellofemoral:  Mild degenerative chondrosis/chondromalacia.

Medial: Mild to moderate degenerative chondrosis with early joint
space narrowing and osteophytic spurring.

Lateral:  Minimal chondromalacia.

Joint: Small to moderate-sized joint effusion. Superior and medial
patellar plica are noted.

Popliteal Fossa:  Very small Baker's cyst.

Extensor Mechanism: The patella retinacular structures are intact
and the quadriceps and patellar tendons are intact. Mild distal
patellar tendinopathy.

Bones: No acute bony findings. Patchy metaphyseal signal abnormality
is not unusual and young females with this body habitus.
IMPRESSION: 1. Large full-thickness radial tear involving the posterior horn of
the medial meniscus at the meniscal root. There is associated medial
protrusion of the meniscus. There are also small inferior and free
edge tears involving the mid body region.
2. Intact ligamentous structures and no acute bony findings.
3. Mild to moderate medial compartment degenerative changes possibly
in part due to the unstable meniscus tear.
4. Small to moderate-sized joint effusion and very small Baker's
cyst.
5. Distal patellar tendinopathy.

## 2022-06-04 ENCOUNTER — Encounter: Payer: Self-pay | Admitting: Podiatry

## 2022-06-04 ENCOUNTER — Ambulatory Visit: Payer: BC Managed Care – PPO | Admitting: Podiatry

## 2022-06-04 DIAGNOSIS — L600 Ingrowing nail: Secondary | ICD-10-CM | POA: Diagnosis not present

## 2022-06-04 NOTE — Progress Notes (Signed)
  Subjective:  Patient ID: Michaela Garcia, female    DOB: 1965/08/21,   MRN: [id number]  Chief Complaint  Patient presents with   Ingrown Toenail    RIGHT TOE INGROWN    57 y.o. female presents for concern of right great ingrown toenail. Relates she has had for several weeks.  Has tried treating at home by trimming without much relief. Relates pain and tenderness in the area . This is her first time dealing with an ingrown. She was seen by PCP and given antibiotic that has helped. She is pre-diabetic.  Denies any other pedal complaints. Denies n/v/f/c.   Past Medical History:  Diagnosis Date   Anxiety    Chest tightness    Depression    past panic attack   HTN (hypertension)    Hypothyroidism    Morbid obesity (HCC)    OSA on CPAP    Thyroid disease     Objective:  Physical Exam: Vascular: DP/PT pulses 2/4 bilateral. CFT <3 seconds. Normal hair growth on digits. No edema.  Skin. No lacerations or abrasions bilateral feet. Incurvation of latera border of right hallux. No erythema edema or purulence noted.  Tender to palpation Musculoskeletal: MMT 5/5 bilateral lower extremities in DF, PF, Inversion and Eversion. Deceased ROM in DF of ankle joint.  Neurological: Sensation intact to light touch.   Assessment:   1. Ingrown right greater toenail      Plan:  Patient was evaluated and treated and all questions answered. Patient requesting removal of ingrown nail today. Procedure below.  Discussed procedure and post procedure care and patient expressed understanding.  Will follow-up in 2 weeks for nail check or sooner if any problems arise.    Procedure:  Procedure: partial Nail Avulsion of right hallux lateral nail border.  Surgeon: Lorenda Peck, DPM  Pre-op Dx: Ingrown toenail with infection Post-op: Same  Place of Surgery: Office exam room.  Indications for surgery: Painful and ingrown toenail.    The patient is requesting removal of nail with chemical matrixectomy.  Risks and complications were discussed with the patient for which they understand and written consent was obtained. Under sterile conditions a total of 3 mL of  1% lidocaine plain was infiltrated in a hallux block fashion. Once anesthetized, the skin was prepped in sterile fashion. A tourniquet was then applied. Next the lateral aspect of hallux nail border was then sharply excised making sure to remove the entire offending nail border.  Next phenol was then applied under standard conditions and copiously irrigated. Silvadene was applied. A dry sterile dressing was applied. After application of the dressing the tourniquet was removed and there is found to be an immediate capillary refill time to the digit. The patient tolerated the procedure well without any complications. Post procedure instructions were discussed the patient for which he verbally understood. Follow-up in two weeks for nail check or sooner if any problems are to arise. Discussed signs/symptoms of infection and directed to call the office immediately should any occur or go directly to the emergency room. In the meantime, encouraged to call the office with any questions, concerns, changes symptoms.   Lorenda Peck, DPM

## 2022-06-04 NOTE — Patient Instructions (Signed)

## 2022-06-18 ENCOUNTER — Ambulatory Visit: Payer: BC Managed Care – PPO | Admitting: Podiatry

## 2022-09-03 ENCOUNTER — Ambulatory Visit: Payer: BC Managed Care – PPO | Admitting: Podiatry

## 2022-09-03 ENCOUNTER — Encounter: Payer: Self-pay | Admitting: Podiatry

## 2022-09-03 ENCOUNTER — Ambulatory Visit (INDEPENDENT_AMBULATORY_CARE_PROVIDER_SITE_OTHER): Payer: BC Managed Care – PPO

## 2022-09-03 DIAGNOSIS — M79671 Pain in right foot: Secondary | ICD-10-CM

## 2022-09-03 DIAGNOSIS — G8929 Other chronic pain: Secondary | ICD-10-CM | POA: Diagnosis not present

## 2022-09-03 DIAGNOSIS — M722 Plantar fascial fibromatosis: Secondary | ICD-10-CM

## 2022-09-03 MED ORDER — MELOXICAM 15 MG PO TABS: 15.0000 mg | ORAL_TABLET | Freq: Every day | ORAL | 0 refills | Status: DC

## 2022-09-03 NOTE — Progress Notes (Signed)
  Subjective:  Patient ID: Michaela Garcia, female    DOB: 17-Nov-1965,   MRN: [id number]  Chief Complaint  Patient presents with   Foot Pain    Pain has become increasingly worse, sharp pain is intermittent, heel is where the pain resides     57 y.o. female presents for new concern today of right heel pain that has been going on for about a month. Relates she did switch up her shoes and has had pain since. She is on her feet taking care of her sister a lot. Has tried some inserts and stretching without relief.  . Denies any other pedal complaints. Denies n/v/f/c.   Past Medical History:  Diagnosis Date   Anxiety    Chest tightness    Depression    past panic attack   HTN (hypertension)    Hypothyroidism    Morbid obesity    OSA on CPAP    Thyroid disease     Objective:  Physical Exam: Vascular: DP/PT pulses 2/4 bilateral. CFT <3 seconds. Normal hair growth on digits. No edema.  Skin. No lacerations or abrasions bilateral feet.  Musculoskeletal: MMT 5/5 bilateral lower extremities in DF, PF, Inversion and Eversion. Deceased ROM in DF of ankle joint. Tender to medial calcaneal and cetnral calcaneus on the right heel. No pain with PT or arch or achilles tendon.  Neurological: Sensation intact to light touch.   Assessment:   1. Plantar fasciitis, right      Plan:  Patient was evaluated and treated and all questions answered. Discussed plantar fasciitis with patient.  X-rays reviewed and discussed with patient. No acute fractures or dislocations noted. Mild spurring noted at inferior calcaneus.  Discussed treatment options including, ice, NSAIDS, supportive shoes, bracing, and stretching. Stretching exercises provided to be done on a daily basis.   Prescription for meloxicam provided and sent to pharmacy. Reivewed BMP and no kidney concern PF brace dispensed.   Follow-up 6 weeks or sooner if any problems arise. In the meantime, encouraged to call the office with any questions,  concerns, change in symptoms.    Lorenda Peck, DPM

## 2022-09-03 NOTE — Patient Instructions (Signed)

## 2022-10-15 ENCOUNTER — Ambulatory Visit: Payer: BC Managed Care – PPO | Admitting: Podiatry

## 2024-06-21 ENCOUNTER — Ambulatory Visit (INDEPENDENT_AMBULATORY_CARE_PROVIDER_SITE_OTHER): Admit: 2024-06-21 | Discharge: 2024-06-21 | Disposition: A | Discharge status: Home / Self Care | Admitting: Radiology

## 2024-06-21 ENCOUNTER — Ambulatory Visit (HOSPITAL_BASED_OUTPATIENT_CLINIC_OR_DEPARTMENT_OTHER): Payer: Self-pay | Admitting: Family Medicine

## 2024-06-21 ENCOUNTER — Encounter (HOSPITAL_BASED_OUTPATIENT_CLINIC_OR_DEPARTMENT_OTHER): Payer: Self-pay

## 2024-06-21 ENCOUNTER — Ambulatory Visit (HOSPITAL_BASED_OUTPATIENT_CLINIC_OR_DEPARTMENT_OTHER)
Admission: RE | Admit: 2024-06-21 | Discharge: 2024-06-21 | Disposition: A | Discharge status: Home / Self Care | Payer: Self-pay | Source: Ambulatory Visit

## 2024-06-21 VITALS — BP 141/91 | HR 60 | Temp 97.8°F | Resp 20

## 2024-06-21 DIAGNOSIS — M25571 Pain in right ankle and joints of right foot: Secondary | ICD-10-CM

## 2024-06-21 DIAGNOSIS — M79671 Pain in right foot: Secondary | ICD-10-CM

## 2024-06-21 DIAGNOSIS — W010XXA Fall on same level from slipping, tripping and stumbling without subsequent striking against object, initial encounter: Secondary | ICD-10-CM | POA: Diagnosis not present

## 2024-06-21 DIAGNOSIS — I890 Lymphedema, not elsewhere classified: Secondary | ICD-10-CM | POA: Diagnosis not present

## 2024-06-21 NOTE — ED Provider Notes (Signed)
 " [facility]    CSN: [id number] Arrival date & time: 06/21/24  9043      History   Chief Complaint Chief Complaint  Patient presents with   Ankle Pain    ankle pain and pain when walking now. - Entered by patient    HPI Michaela Garcia is a 59 y.o. female.   59 year old female with report of right foot and ankle pain after a fall.  She fell on approximately 06/14/2024 or even possibly 06/13/2024.  She basically slipped and stumbled and fell forward and in her words, she face planted.  After the fall she thought she was fine but since that time she has developed worsening right lateral foot pain and right ankle pain.  She has chronic severe lymphedema of her legs and has some stasis dermatitis but she does not have visible swelling in the right foot.  She does have pain with compression of the lateral right foot and ankle.  She has some pain with weightbearing and some pain with movement of the foot.   Ankle Pain Associated symptoms: no back pain and no fever     Past Medical History:  Diagnosis Date   Anxiety    Chest tightness    Depression    past panic attack   HTN (hypertension)    Hypothyroidism    Morbid obesity (HCC)    OSA on CPAP    Thyroid disease     Patient Active Problem List   Diagnosis Date Noted   Chest pain 02/13/2011    Past Surgical History:  Procedure Laterality Date   CHOLECYSTECTOMY     KNEE ARTHROSCOPY WITH MEDIAL MENISECTOMY Right 09/03/2015   Procedure: RIGHT KNEE ARTHROSCOPY WITH MEDIAL MENISECTOMY;  Surgeon: Marcey Raman, MD;  Location: [facility] OR;  Service: Orthopedics;  Laterality: Right;    OB History   No obstetric history on file.      Home Medications    Prior to Admission medications  Medication Sig Start Date End Date Taking? Authorizing Provider  cyclobenzaprine (FLEXERIL) 10 MG tablet Take 5-10 mg by mouth at bedtime as needed.   Yes [provider]  irbesartan (AVAPRO) 300 MG tablet Take 300 mg by mouth  daily. 08/10/15  Yes [provider]  irbesartan (AVAPRO) 300 MG tablet Take 300 mg by mouth daily. 10/17/21  Yes [provider]  levothyroxine (SYNTHROID, LEVOTHROID) 200 MCG tablet Take 200 mcg by mouth daily before breakfast. Takes with 50 mcg tablet to = 250 mcg daily 08/10/15  Yes [provider]  sertraline (ZOLOFT) 50 MG tablet Take 50 mg by mouth daily.   Yes [provider]  acetaminophen  (RA ACETAMINOPHEN ) 650 MG CR tablet Take 1,300 mg by mouth 2 (two) times daily as needed for pain.     [provider]  levothyroxine (SYNTHROID, LEVOTHROID) 50 MCG tablet Take 50 mcg by mouth daily before breakfast. Takes with 200 mcg tablet to = 250 mcg daily 08/10/15   [provider]  NON FORMULARY CPAP    [provider]    Family History Family History  Problem Relation Age of Onset   Heart disease Other     Social History Social History[1]   Allergies   Ace inhibitors   Review of Systems Review of Systems  Constitutional:  Negative for chills and fever.  HENT:  Negative for ear pain and sore throat.   Eyes:  Negative for pain and visual disturbance.  Respiratory:  Negative for cough  and shortness of breath.   Cardiovascular:  Positive for leg swelling (Chronic severe lymphedema of both lower legs.). Negative for chest pain and palpitations.  Gastrointestinal:  Negative for abdominal pain, constipation, diarrhea, nausea and vomiting.  Genitourinary:  Negative for dysuria and hematuria.  Musculoskeletal:  Positive for arthralgias (Lateral right foot and lateral ankle pain). Negative for back pain.  Skin:  Negative for color change and rash.  Neurological:  Negative for seizures and syncope.  All other systems reviewed and are negative.    Physical Exam Triage Vital Signs ED Triage Vitals  Encounter Vitals Group     BP 06/21/24 1027 (!) 141/91     Girls Systolic BP Percentile --      Girls Diastolic BP Percentile --       Boys Systolic BP Percentile --      Boys Diastolic BP Percentile --      Pulse Rate 06/21/24 1027 60     Resp 06/21/24 1027 20     Temp 06/21/24 1027 97.8 F (36.6 C)     Temp Source 06/21/24 1027 Oral     SpO2 06/21/24 1027 96 %     Weight --      Height --      Head Circumference --      Peak Flow --      Pain Score 06/21/24 1025 6     Pain Loc --      Pain Education --      Exclude from Growth Chart --    No data found.  Updated Vital Signs BP (!) 141/91 (BP Location: Right Wrist)   Pulse 60   Temp 97.8 F (36.6 C) (Oral)   Resp 20   LMP 07/10/2015   SpO2 96%   Visual Acuity Right Eye Distance:   Left Eye Distance:   Bilateral Distance:    Right Eye Near:   Left Eye Near:    Bilateral Near:     Physical Exam Vitals and nursing note reviewed.  Constitutional:      General: She is not in acute distress.    Appearance: She is well-developed. She is morbidly obese. She is not ill-appearing, toxic-appearing or diaphoretic.  HENT:     Head: Normocephalic and atraumatic.     Right Ear: Hearing, tympanic membrane, ear canal and external ear normal.     Left Ear: Hearing, tympanic membrane, ear canal and external ear normal.     Nose: No congestion or rhinorrhea.     Right Sinus: No maxillary sinus tenderness or frontal sinus tenderness.     Left Sinus: No maxillary sinus tenderness or frontal sinus tenderness.     Mouth/Throat:     Lips: Pink.     Mouth: Mucous membranes are moist.     Pharynx: Uvula midline. No oropharyngeal exudate or posterior oropharyngeal erythema.     Tonsils: No tonsillar exudate.  Eyes:     Conjunctiva/sclera: Conjunctivae normal.     Pupils: Pupils are equal, round, and reactive to light.  Cardiovascular:     Rate and Rhythm: Normal rate and regular rhythm.     Pulses:          Dorsalis pedis pulses are 2+ on the right side.       Posterior tibial pulses are 2+ on the right side and 2+ on the left side.     Heart sounds: S1  normal and S2 normal. No murmur heard. Pulmonary:     Effort: Pulmonary effort  is normal. No respiratory distress.     Breath sounds: Normal breath sounds. No decreased breath sounds, wheezing, rhonchi or rales.  Abdominal:     General: Bowel sounds are normal.     Palpations: Abdomen is soft.     Tenderness: There is no abdominal tenderness.  Musculoskeletal:        General: No swelling.     Cervical back: Neck supple.     Right upper leg: Edema present.     Left upper leg: Edema present.     Right knee: Swelling present.     Left knee: Swelling present.     Right lower leg: Swelling present. 2+ Pitting Edema present.     Left lower leg: Swelling present. 2+ Pitting Edema present.     Right ankle: No swelling, deformity, ecchymosis or lacerations. Tenderness present over the lateral malleolus. Normal range of motion. Anterior drawer test negative. Normal pulse.     Right Achilles Tendon: Normal.     Left ankle: Normal.     Left Achilles Tendon: Normal.     Right foot: Decreased range of motion (Due to pain). Normal capillary refill. Tenderness (Lateral midfoot) and bony tenderness (Lateral midfoot) present. No swelling, deformity, bunion, Charcot foot, foot drop, prominent metatarsal heads, laceration or crepitus. Normal pulse.     Left foot: Normal. Normal range of motion. No deformity, bunion, Charcot foot, foot drop or prominent metatarsal heads.  Lymphadenopathy:     Head:     Right side of head: No submental, submandibular, tonsillar, preauricular or posterior auricular adenopathy.     Left side of head: No submental, submandibular, tonsillar, preauricular or posterior auricular adenopathy.     Cervical: No cervical adenopathy.     Right cervical: No superficial cervical adenopathy.    Left cervical: No superficial cervical adenopathy.  Skin:    General: Skin is warm and dry.     Capillary Refill: Capillary refill takes less than 2 seconds.     Findings: No rash.   Neurological:     Mental Status: She is alert and oriented to person, place, and time.  Psychiatric:        Mood and Affect: Mood normal.      UC Treatments / Results  Labs (all labs ordered are listed, but only abnormal results are displayed) Labs Reviewed - No data to display  EKG   Radiology No results found.  Procedures Procedures (including critical care time)  Medications Ordered in UC Medications - No data to display  Initial Impression / Assessment and Plan / UC Course  I have reviewed the triage vital signs and the nursing notes.  Pertinent labs & imaging results that were available during my care of the patient were reviewed by me and considered in my medical decision making (see chart for details).  Plan of Care (see discharge instructions for additional patient precautions and education): Right foot and ankle pain after a fall: X-rays are negative for fractures nor dislocations.  Use acetaminophen  650 mg as needed for pain.  Ice and elevation may be helpful.    If pain persist see family doctor.  May need to see orthopedics.  Chronic lymphedema of legs: Follow-up with primary care regarding this chronic medical condition.  I reviewed the plan of care with the patient and/or the patient's guardian.  The patient and/or guardian had time to ask questions and acknowledged that the questions were answered.  Final Clinical Impressions(s) / UC Diagnoses   Final diagnoses:  Right foot pain  Acute right ankle pain  Lymphedema  Fall on same level from slipping, tripping or stumbling, initial encounter     Discharge Instructions      Right foot and ankle pain after a fall: X-rays are negative for fractures nor dislocations.  Use acetaminophen  650 mg as needed for pain.  Ice and elevation may be helpful.    If pain persist see family doctor.  May need to see orthopedics.  Chronic lymphedema of legs: Follow-up with primary care regarding this chronic medical  condition.     ED Prescriptions   None    PDMP not reviewed this encounter.    [1]  Social History Tobacco Use   Smoking status: Never  Substance Use Topics   Alcohol use: No   Drug use: No     Ival Domino, FNP 06/21/24 1113  "

## 2024-06-21 NOTE — Discharge Instructions (Addendum)
 Right foot and ankle pain after a fall: X-rays are negative for fractures nor dislocations.  Use acetaminophen  650 mg as needed for pain.  Ice and elevation may be helpful.    If pain persist see family doctor.  May need to see orthopedics.  Chronic lymphedema of legs: Follow-up with primary care regarding this chronic medical condition.

## 2024-06-21 NOTE — ED Triage Notes (Signed)
 Pt fel at home last week she did more of a face plant to the floor. Pt reports right ankle pain after the fall.

## 2024-06-21 NOTE — Progress Notes (Signed)
 Soft tissue swelling of the right foot but no acute bony abnormality.  She does have a large heel spur on the right foot.  Patient updated via voicemail message.
